# Patient Record
Sex: Female | Born: 1935
Health system: Southern US, Community
[De-identification: ages and names within clinical notes are randomized; demographics above are authoritative.]

---

## 2004-04-16 ENCOUNTER — Ambulatory Visit: Payer: Self-pay | Admitting: Internal Medicine

## 2004-05-12 ENCOUNTER — Ambulatory Visit: Payer: Self-pay | Admitting: Ophthalmology

## 2005-05-13 ENCOUNTER — Ambulatory Visit: Payer: Self-pay | Admitting: Internal Medicine

## 2005-11-29 ENCOUNTER — Encounter: Payer: Self-pay | Admitting: Rheumatology

## 2006-08-04 ENCOUNTER — Ambulatory Visit: Payer: Self-pay | Admitting: Internal Medicine

## 2007-06-21 ENCOUNTER — Ambulatory Visit: Payer: Self-pay | Admitting: Ophthalmology

## 2007-10-19 ENCOUNTER — Ambulatory Visit: Payer: Self-pay | Admitting: Family Medicine

## 2008-06-24 ENCOUNTER — Ambulatory Visit: Payer: Self-pay | Admitting: Family Medicine

## 2008-07-02 ENCOUNTER — Ambulatory Visit: Payer: Self-pay | Admitting: Family Medicine

## 2008-07-08 ENCOUNTER — Ambulatory Visit: Payer: Self-pay | Admitting: Internal Medicine

## 2008-07-16 ENCOUNTER — Ambulatory Visit: Payer: Self-pay | Admitting: Internal Medicine

## 2013-07-01 ENCOUNTER — Emergency Department: Payer: Self-pay | Admitting: Emergency Medicine

## 2013-07-11 ENCOUNTER — Inpatient Hospital Stay: Payer: Self-pay | Admitting: Specialist

## 2013-07-11 LAB — URINALYSIS, COMPLETE
Bilirubin,UR: NEGATIVE
Ketone: NEGATIVE
Nitrite: NEGATIVE
RBC,UR: 4 /HPF (ref 0–5)
Specific Gravity: 1.021 (ref 1.003–1.030)
Squamous Epithelial: 4
WBC UR: 67 /HPF (ref 0–5)

## 2013-07-11 LAB — CBC
HCT: 33.6 % — ABNORMAL LOW (ref 35.0–47.0)
HGB: 11.2 g/dL — ABNORMAL LOW (ref 12.0–16.0)
MCH: 27.9 pg (ref 26.0–34.0)
MCHC: 33.4 g/dL (ref 32.0–36.0)
Platelet: 306 10*3/uL (ref 150–440)
WBC: 8.8 10*3/uL (ref 3.6–11.0)

## 2013-07-11 LAB — COMPREHENSIVE METABOLIC PANEL
Albumin: 3 g/dL — ABNORMAL LOW (ref 3.4–5.0)
Anion Gap: 4 — ABNORMAL LOW (ref 7–16)
BUN: 22 mg/dL — ABNORMAL HIGH (ref 7–18)
Bilirubin,Total: 0.4 mg/dL (ref 0.2–1.0)
Calcium, Total: 8.6 mg/dL (ref 8.5–10.1)
Chloride: 98 mmol/L (ref 98–107)
Co2: 32 mmol/L (ref 21–32)
EGFR (African American): 54 — ABNORMAL LOW
EGFR (Non-African Amer.): 46 — ABNORMAL LOW
Glucose: 97 mg/dL (ref 65–99)
SGPT (ALT): 32 U/L (ref 12–78)
Sodium: 134 mmol/L — ABNORMAL LOW (ref 136–145)

## 2013-07-11 LAB — TROPONIN I: Troponin-I: <0.02 ng/mL

## 2013-07-11 LAB — LIPASE, BLOOD: Lipase: 405 U/L — ABNORMAL HIGH (ref 73–393)

## 2013-07-12 LAB — CLOSTRIDIUM DIFFICILE(ARMC)

## 2013-08-12 DEATH — deceased

## 2014-08-25 IMAGING — CT CT HEAD WITHOUT CONTRAST
2 of 3 series · 17 of 30 positions shown, 20 images · non-contrast
Comparison: 07/01/2013

CLINICAL DATA: Disorientation.  Abdominal pain.

EXAM:
CT HEAD WITHOUT CONTRAST
TECHNIQUE: Contiguous axial images were obtained from the base of the skull
through the vertex without intravenous contrast.

[Series 2: head wo · axial · 0.44mm/px · z∈[+10,+128]mm · 10 of 29 slices shown, 13 images]
[im 3/29  brain]
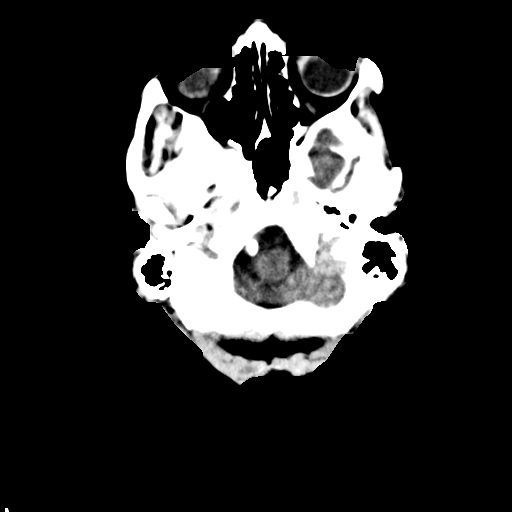
[im 3/29  bone]
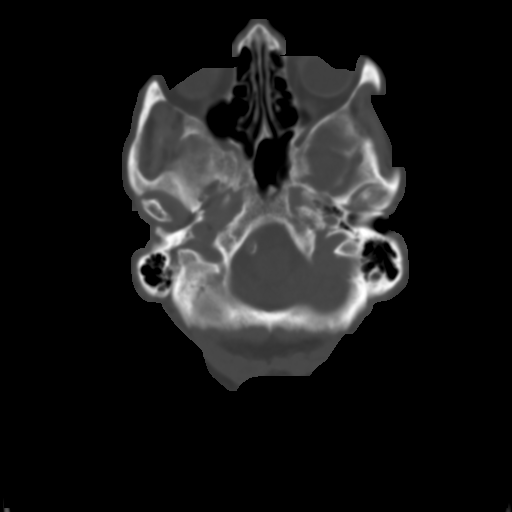
[im 6/29  brain]
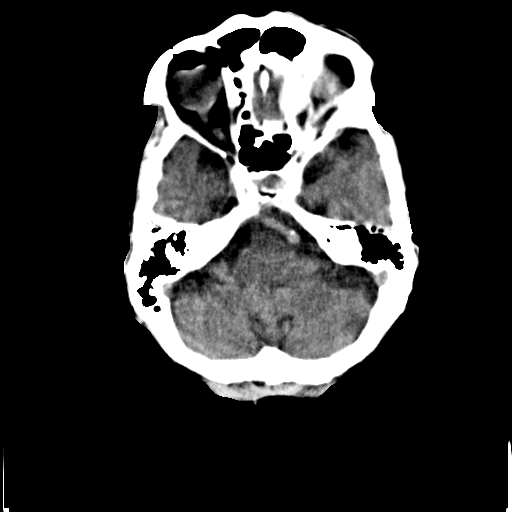
[im 8/29  brain]
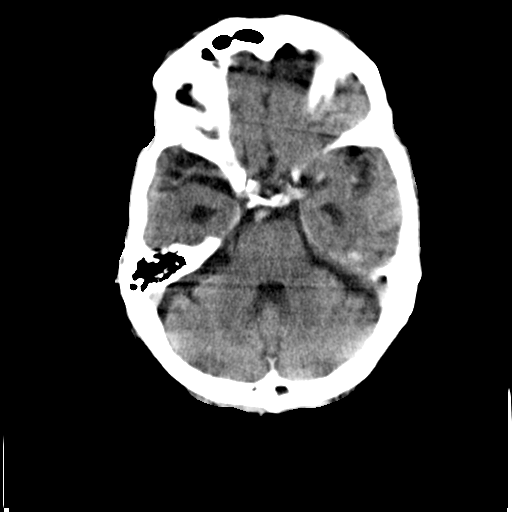
[im 11/29  brain]
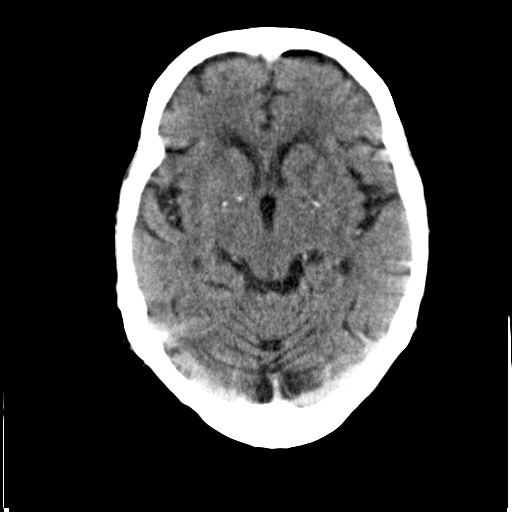
[im 13/29  brain]
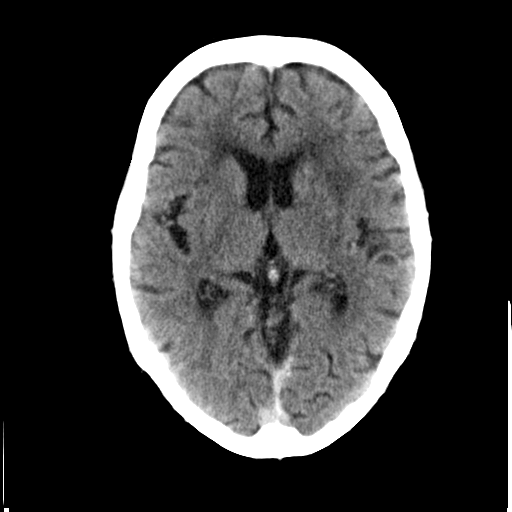
[im 13/29  bone]
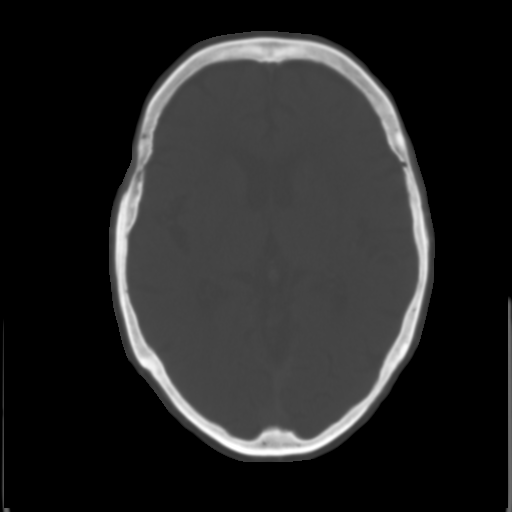
[im 16/29  brain]
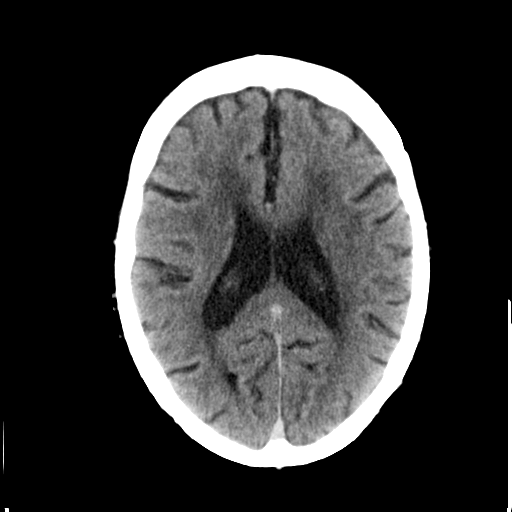
[im 18/29  brain]
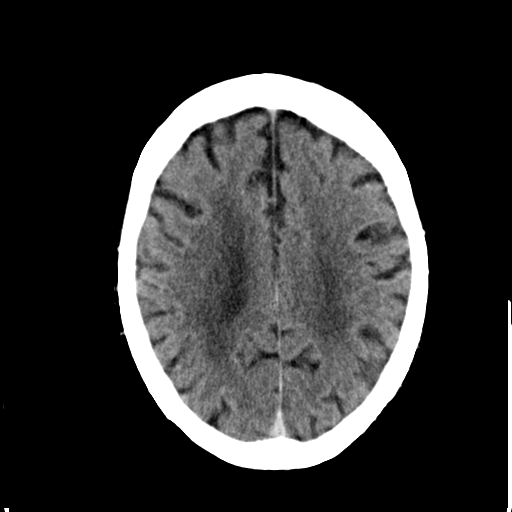
[im 21/29  brain]
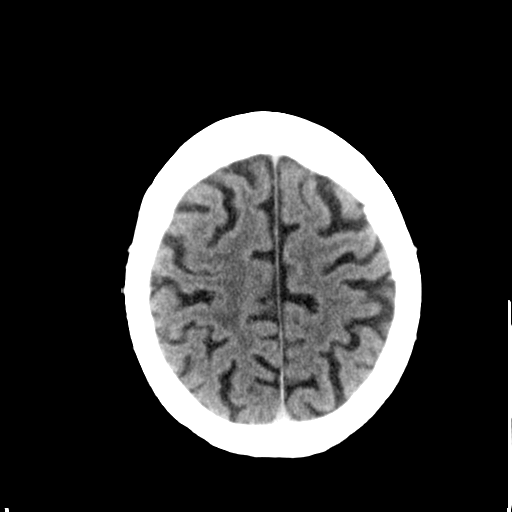
[im 23/29  brain]
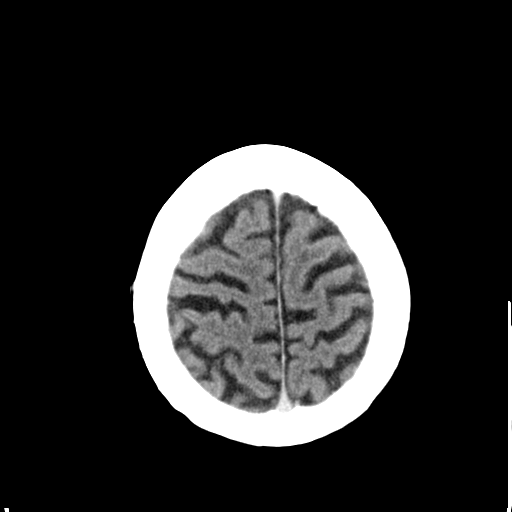
[im 23/29  bone]
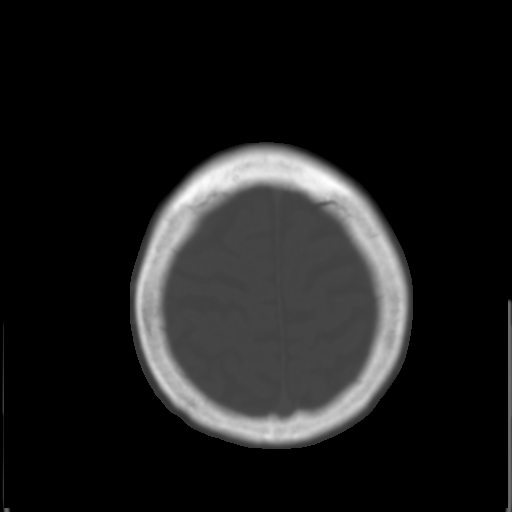
[im 26/29  brain]
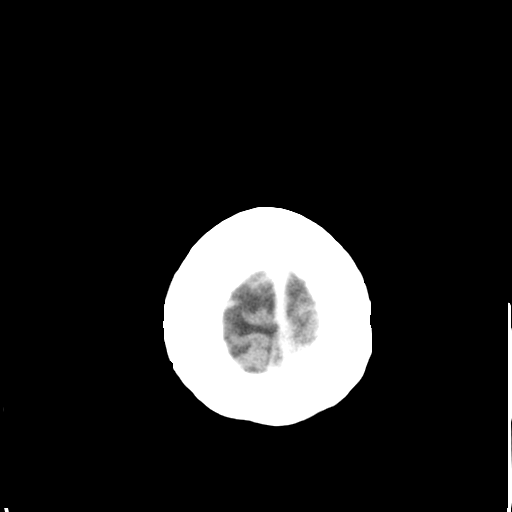

[Series 3: head bone · axial · 0.44mm/px · z∈[+10,+108]mm · 7 of 30 slices shown]
[im 3/30  bone]
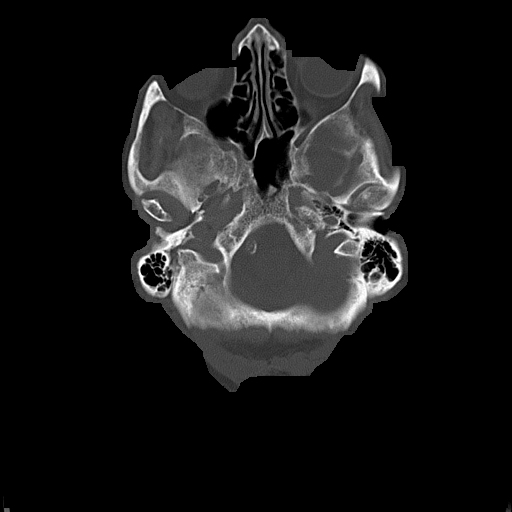
[im 8/30  bone]
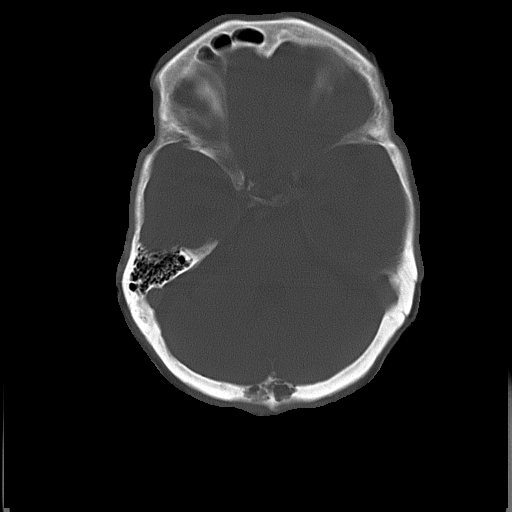
[im 10/30  bone]
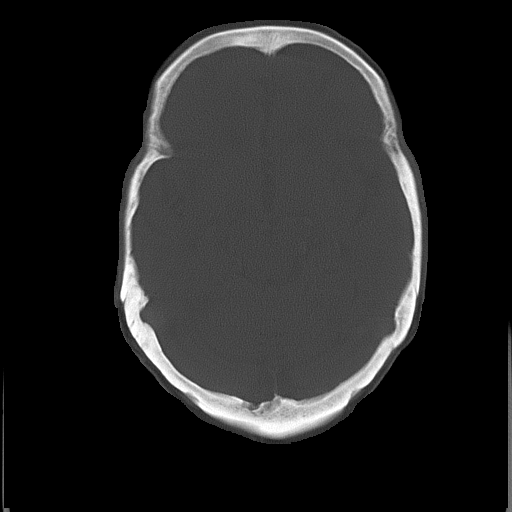
[im 13/30  bone]
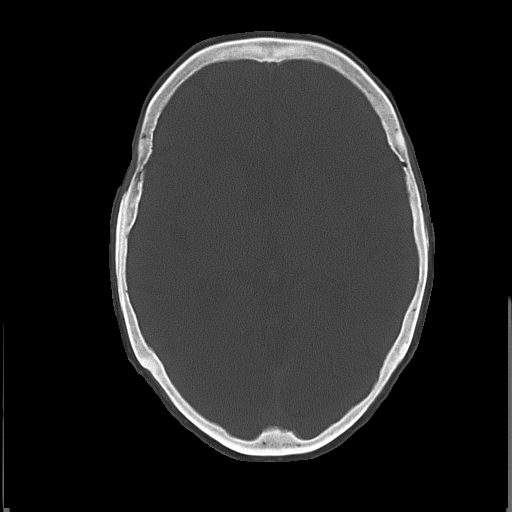
[im 17/30  bone]
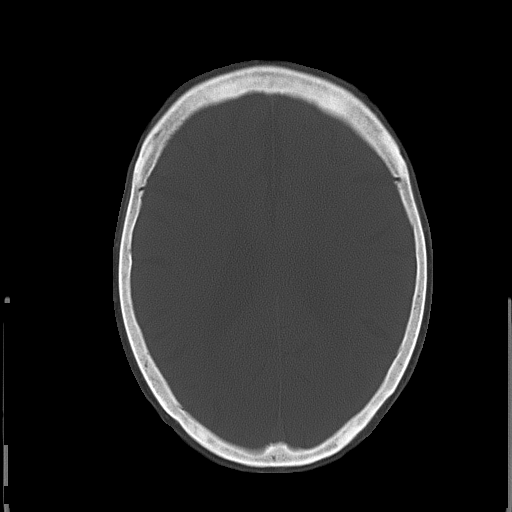
[im 20/30  bone]
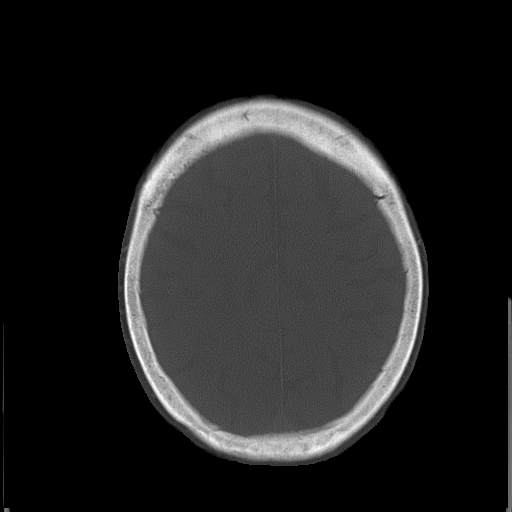
[im 22/30  bone]
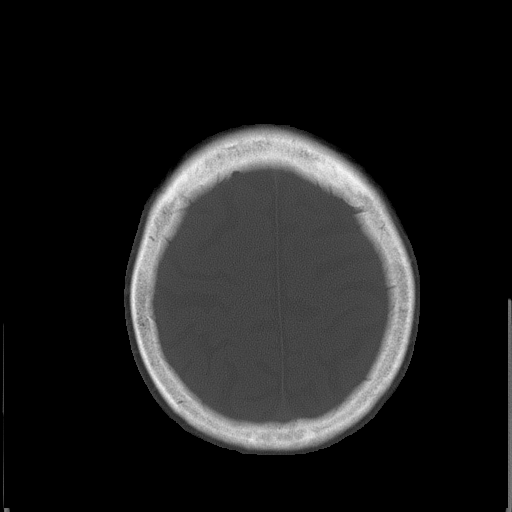

[17 of 30 positions shown; findings below may reference images not displayed]

FINDINGS: The brainstem, cerebellum, cerebral peduncles, thalamus, basal
ganglia, basilar cisterns, and ventricular system appear within
normal limits. Periventricular white matter and corona radiata
hypodensities favor chronic ischemic microvascular white matter
disease. No intracranial hemorrhage, mass lesion, or acute CVA.
IMPRESSION: 1. No acute intracranial findings. Periventricular white matter and
corona radiata hypodensities favor chronic ischemic microvascular
white matter disease.

## 2014-08-25 IMAGING — CT CT ABD-PELV W/ CM
2 of 5 series · 15 of 46 positions shown, 17 images · IV contrast (isovue)
Comparison: None.

CLINICAL DATA: Diffuse abdominal pain.  Constipation.

EXAM:
CT ABDOMEN AND PELVIS WITH CONTRAST
TECHNIQUE: Multidetector CT imaging of the abdomen and pelvis was performed
using the standard protocol following bolus administration of
intravenous contrast.
CONTRAST:  80 cc Isovue 370

[Series 6: cor routine abd pel with · coronal · 0.62mm/px · 3 of 112 slices shown]
[im 38/112  soft-tissue]
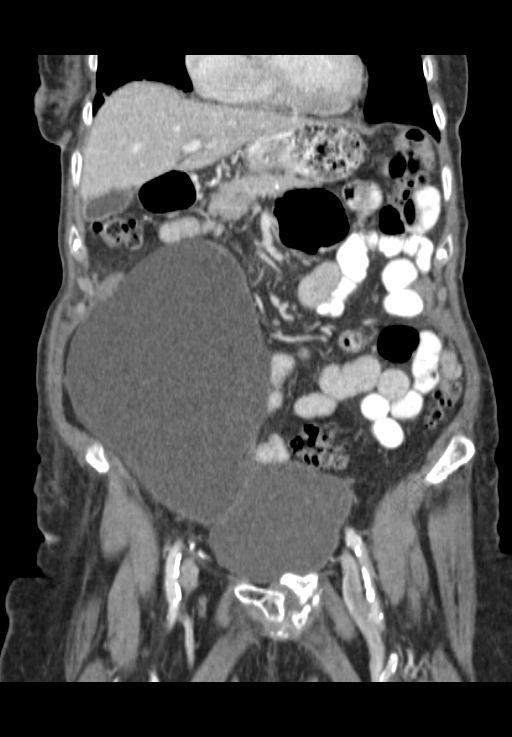
[im 50/112  soft-tissue]
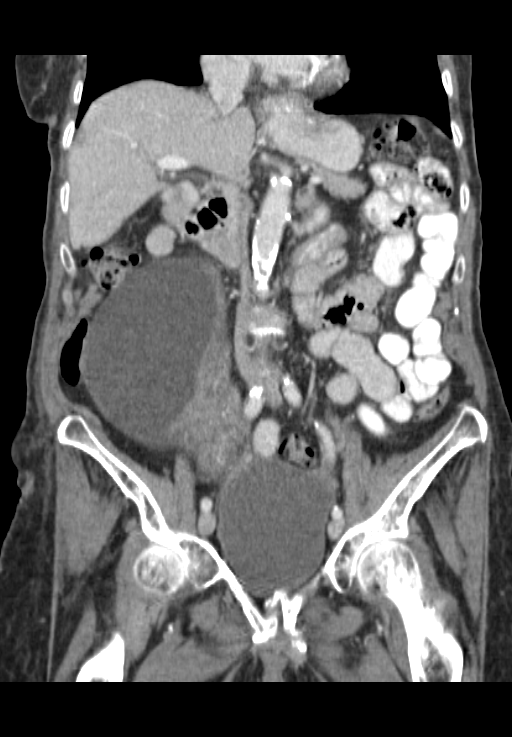
[im 62/112  soft-tissue]
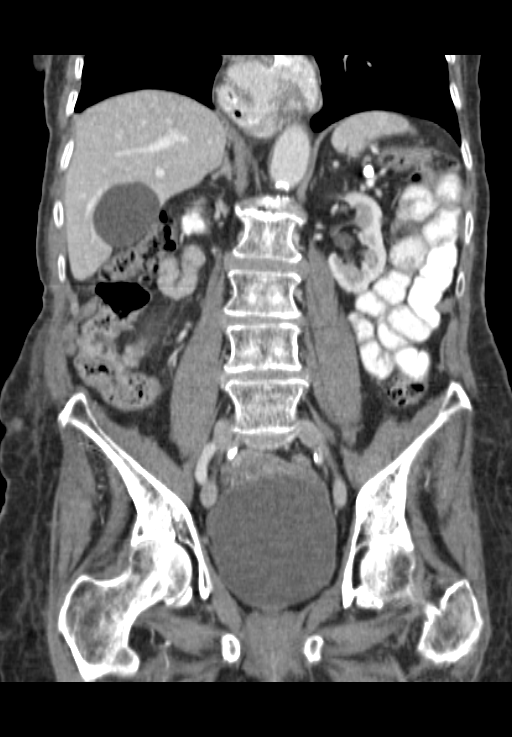

[Series 8: routine abd pel with 2 · axial · 0.64mm/px · z∈[-329,+1]mm · 12 of 79 slices shown, 14 images]
[im 7/79  soft-tissue]
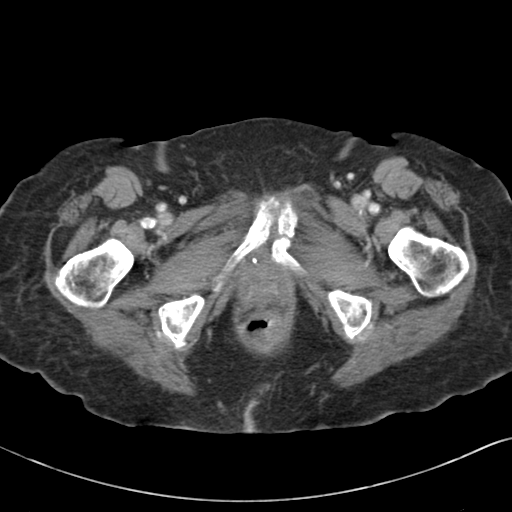
[im 7/79  bone]
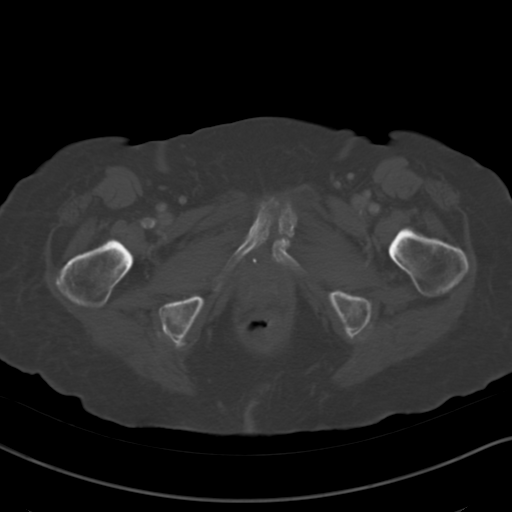
[im 13/79  soft-tissue]
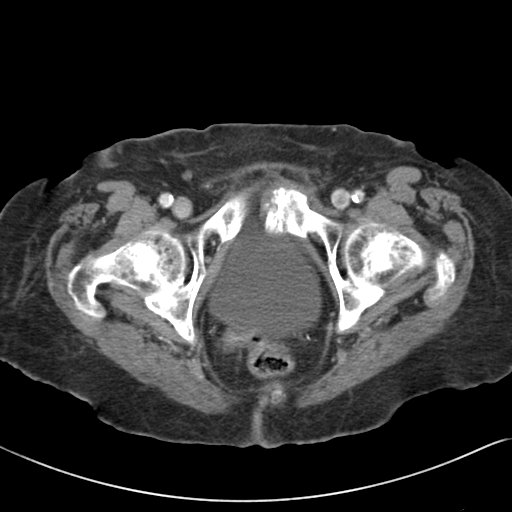
[im 19/79  soft-tissue]
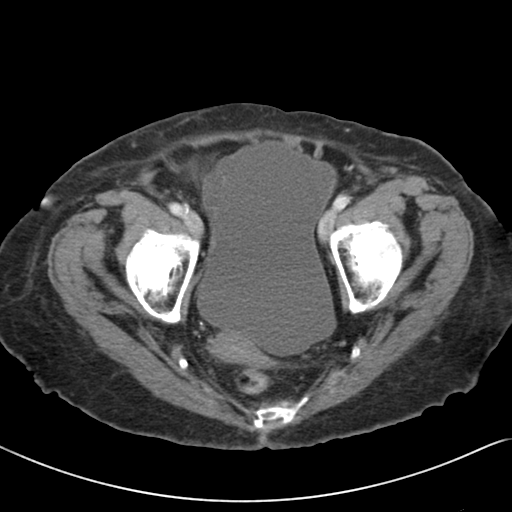
[im 25/79  soft-tissue]
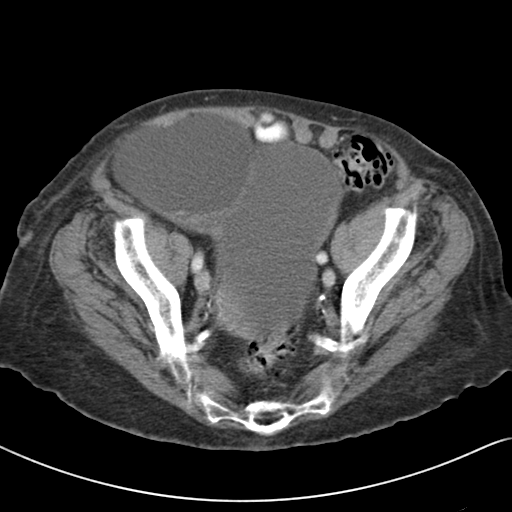
[im 31/79  soft-tissue]
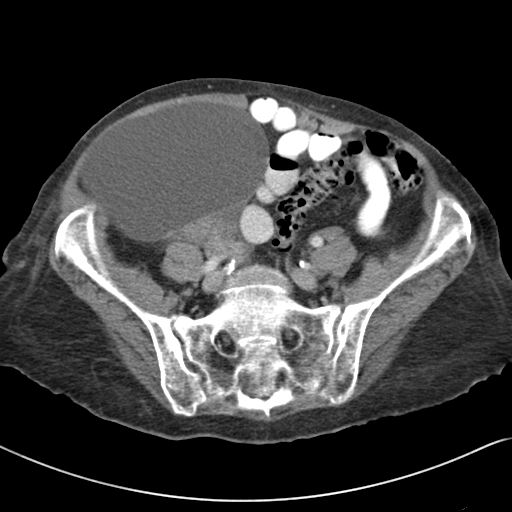
[im 37/79  soft-tissue]
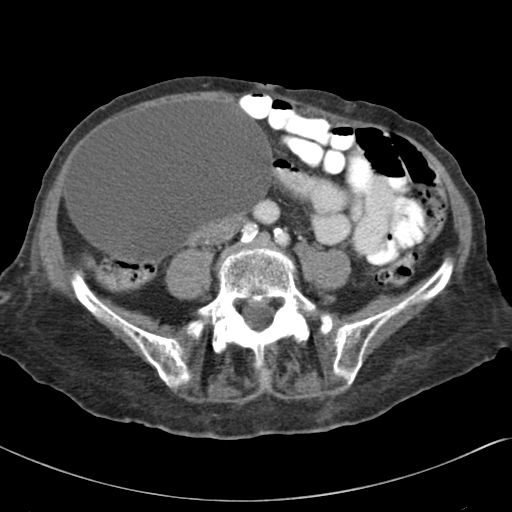
[im 43/79  soft-tissue]
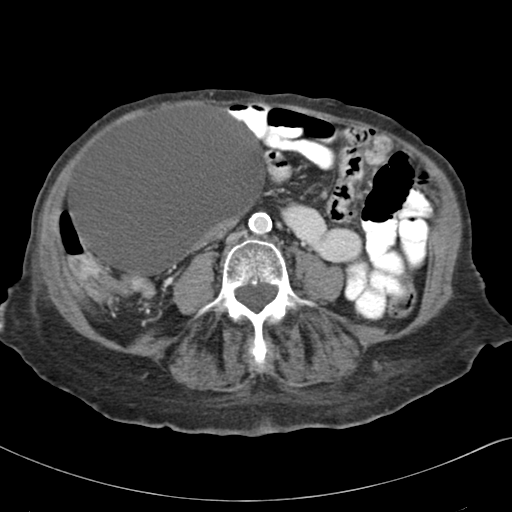
[im 49/79  soft-tissue]
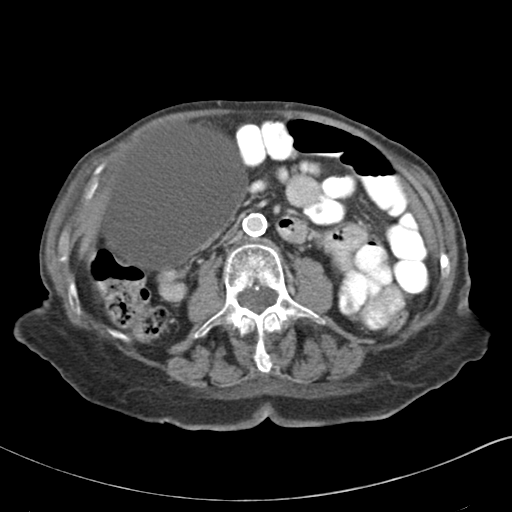
[im 55/79  soft-tissue]
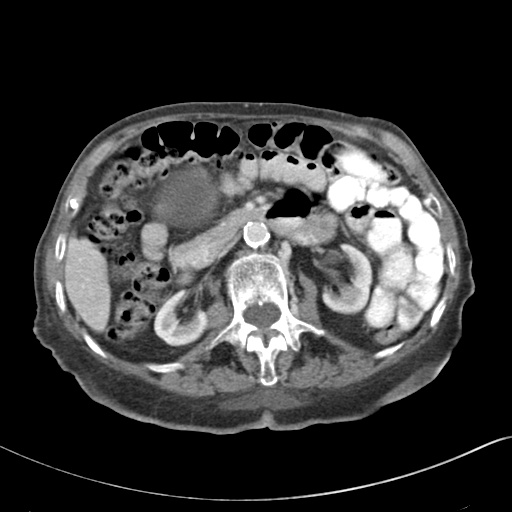
[im 55/79  bone]
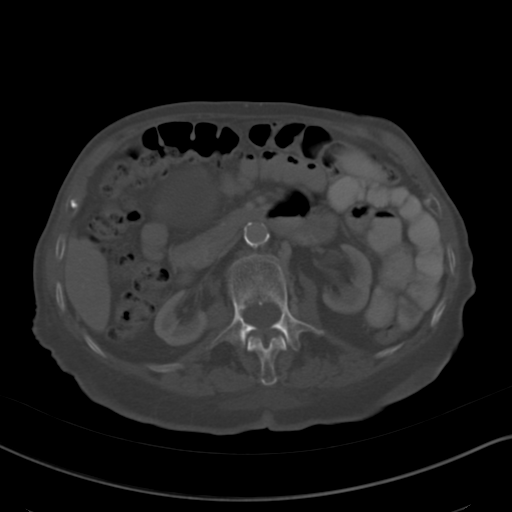
[im 61/79  soft-tissue]
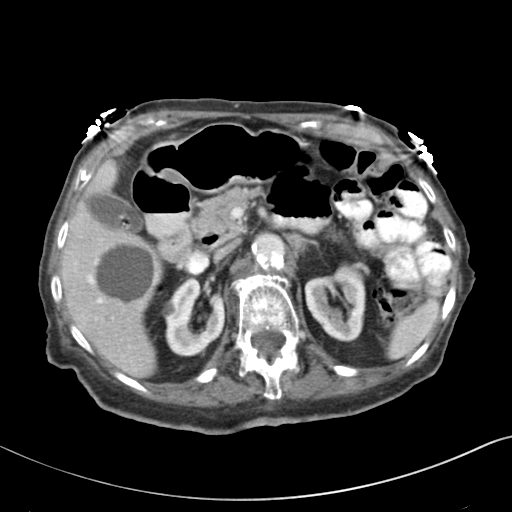
[im 67/79  soft-tissue]
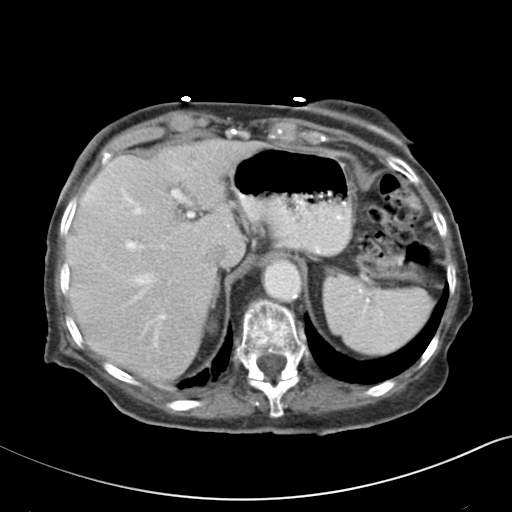
[im 73/79  soft-tissue]
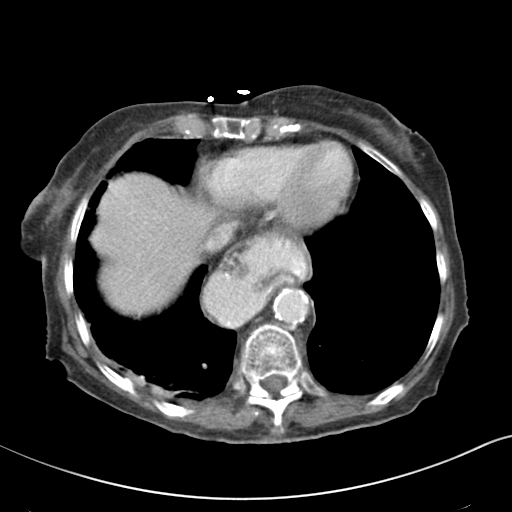

[15 of 46 positions shown; findings below may reference images not displayed]

FINDINGS: A moderate size hiatal hernia is seen. Cholelithiasis is
demonstrated, however there is no evidence of acute cholecystitis.
No evidence of biliary ductal dilatation. Large duodenal diverticula
incidentally noted.

A small left hepatic lobe hemangioma is seen as well as the other
benign hepatic cysts in both right and left lobes. The pancreas,
spleen, and adrenal glands are normal in appearance. Tiny renal
cysts noted bilaterally however there is no evidence of renal masses
or hydronephrosis.

A large cystic lesion is seen in the right abdomen and pelvis which
appears to originate from the right ovary. This shows some mural
soft tissue density posteriorly as well as a tiny calcification.
This measures 18.3 by 13.1 x 13.5 cm, and is suspicious for a cystic
ovarian neoplasm. Uterus is unremarkable in appearance.

The urinary bladder is mildly dilated and shows mild wall
thickening, suspicious for neurogenic bladder. No focal bladder mass
visualized. Old bilateral pubic rami fracture deformities are noted.

Severe colonic diverticulosis is demonstrated, however there is no
evidence of diverticulitis. No other inflammatory process or
abnormal fluid collections are identified.
IMPRESSION: 18 cm right adnexal cystic lesion with peripheral soft tissue
density posteriorly, suspicious for cystic ovarian neoplasm.
Surgical evaluation should be considered.

Moderate hiatal hernia.

Distended urinary bladder with mild wall thickening, suspicious for
neurogenic bladder. Old bilateral pubic rami fracture deformities
noted.

Cholelithiasis. No radiographic evidence of cholecystitis.

Diverticulosis. No radiographic evidence of diverticulitis.

Benign hepatic hemangioma and cysts.

## 2014-11-02 NOTE — Discharge Summary (Signed)
PATIENT NAME:  Lisa Decker, [name] MR#:  [id number] DATE OF BIRTH:  1916/08/04  DATE OF ADMISSION:  07/11/2013 DATE OF DISCHARGE:  07/16/2013  For a detailed note, please take a look at the history and physical done on admission by Dr. Angelica Ranavid Hower.   DIAGNOSES AT DISCHARGE: Abdominal pain, likely related to ovarian cancer. Ovarian cancer.   DISPOSITION:  The patient is being discharged to hospice home.   DISCHARGE MEDICATIONS: Tylenol 500 mg q.6 hours as needed, Roxanol 0.25 mL q.1 to 2 hours as needed, Xanax 1 mg 0.5 mL t.i.d. as needed, Zofran 4 mg t.i.d. as needed.   BRIEF HOSPITAL COURSE: This is a 81 year old female with medical problems as mentioned above, presented to the hospital on July 12, 2013, due to abdominal pain. The patient underwent a CT scan of the abdomen and pelvis which showed an adnexal lesion suspicious for ovarian cancer. The patient also had a CT of the head done for workup for metastases which was negative. Given her advanced age and a new diagnosis for ovarian cancer, the family opted for just comfort care and no further aggressive management. The patient was, therefore, just admitted under comfort care only. The patient was placed on a morphine drip along with Ativan and a scopolamine patch. The patient remained fairly comfortable throughout the hospital course and eventually was discharged to hospice home on 07/16/2013. The family was aware of this plan.   TIME SPENT ON DISCHARGE: 35 minutes.   ____________________________ Rolly PancakeVivek J. Cherlynn KaiserSainani, MD vjs:cs D: 07/16/2013 16:17:26 ET T: 07/16/2013 19:08:47 ET JOB#: [id number]  cc: Rolly PancakeVivek J. Cherlynn KaiserSainani, MD, <Dictator> Houston SirenVIVEK J Eshan Trupiano MD ELECTRONICALLY SIGNED 08/08/2013 11:19

## 2014-11-02 NOTE — H&P (Signed)
PATIENT NAME:  Lisa Decker, Phylliss C MR#:  [id number] DATE OF BIRTH:  [birth year]  DATE OF ADMISSION:  07/11/2013  REFERRING PHYSICIAN: Dr. Mayford KnifeWilliams.   PRIMARY CARE PHYSICIAN: Dr. Alison MurrayAndreassi    CHIEF COMPLAINT: Abdominal pain.  HISTORY OF PRESENT ILLNESS:  This is a 81 year old Caucasian female with past medical history of dementia, who is presenting with abdominal pain from her nursing facility. She is unable to provide any further information secondary to dementia; however, her family at bedside attests to her having intermittent abdominal pain of 4 days' duration, which right-sided without radiation, with associated constipation, unable to fully grade quality or intensity. She describes no worsening or relieving factors. She did have a bowel movement just today after receiving Fleet enema; however, no bowel movements for the prior four days and she was brought to the Emergency Department for further work-up and evaluation. On an abdominal CT, she was found to have an 18 cm adnexal mass. With this information discussion between the Emergency Department staff and the family and decision was made to make her comfort care only. Currently, the patient is without complaints.   REVIEW OF SYSTEMS: Unable to obtain secondary to baseline mental status.   PAST MEDICAL HISTORY: Dementia as well as arthritis and hypertension.   SOCIAL HISTORY: Denies any alcohol, tobacco or drug usage, per family; however, she is essentially bedridden at her nursing facility.   FAMILY HISTORY: Denies any known cardiovascular disease.   ALLERGIES: No known drug allergies.   HOME MEDICATIONS: Include acetaminophen 500 mg p.o. q. 6 hours for pain or fever, alprazolam 0.25 mg p.o. b.i.d. as needed for anxiety, escitalopram 10 mg p.o. daily, hydrochlorothiazide 12.5 mg p.o. daily.   PHYSICAL EXAMINATION: VITAL SIGNS: Temperature 97.7, heart rate 94, respirations 18, blood pressure 106/79, saturating 96% on room air. Weight 54.4  kg, BMI 21.3.  GENERAL: Malnourished, frail Caucasian female who is currently in no acute distress.  HEAD: Normocephalic, atraumatic.  EYES: Pupils are equal, round and reactive to light, extraocular muscles are intact.  No scleral icterus.  MOUTH: Dry mucosal membranes. Dentition intact. No abscess noted.  EAR, NOSE, THROAT:  Throat clear without exudates. No external lesions.  NECK: Supple. No thyromegaly. No nodules. No JVD.  PULMONARY: Clear to auscultation bilaterally. No wheezes, rales or rhonchi. No use of accessory muscles. Good respiratory effort.  CHEST: Nontender to palpation.  CARDIOVASCULAR: S1, S2, regular rate and rhythm. No murmurs, rubs, or gallops. Edema 2+ bilaterally. Pedal pulses 2+.  GASTROINTESTINAL: Soft, nontender, mildly distended. No masses. Hypoactive bowel sounds. No hepatosplenomegaly.  MUSCULOSKELETAL: No swelling or clubbing. However, edema 2 to 3+ as noted above. Range of motion full in all extremities.  NEUROLOGIC: Cranial nerves II through XII intact. Sensation appears to be intact. Reflexes intact.  SKIN: No ulcerations, lesions, rash or cyanosis. Skin warm, dry, skin turgor poor.  PSYCHIATRIC: Mood and affect: The patient agitated and confused. She is awake; however, not oriented to person and place or time. Insight and judgment are poor.   LABORATORY DATA: Sodium 134, potassium 3.4, chloride 98, bicarbonate 32, BUN 22, creatinine 1.03, glucose 97, lipase 405, total protein 6.5, albumin 3, alkaline phosphatase 137, AST 39, the remainder of LFTs are within normal limits. WBC 8.8, hemoglobin 11.2, platelets of 306.   Urinalysis: WBCs 657, RBCs 4, leukocyte esterase 1+, epithelial cells 4.   CT abdomen performed revealing an 18 cm right adnexal cystic lesion with peripheral soft tissue density, suspicious for cystic ovarian neoplasm, as well as a  moderate hiatal hernia, distended urinary bladder with mild wall thickening suspicious for neurogenic bladder.    ASSESSMENT AND PLAN: A 81 year old white female history of dementia, presenting with abdominal pain which has been present for four days. 1. Abdominal pain related to constipation as well as newly found ovarian cancer. The family wishes the patient to be comfort care only. After discussion of the case, she will be admitted for pain control. We will provide as needed morphine as well as Haldol as needed for agitation. We will consult palliative care.  2.  For her anxiety, we will continue her as needed alprazolam as well as her Lexapro.   She once again she will be made DO NOT RESUSCITATE and comfort measures only.   TIME SPENT: 45 minutes    ____________________________ Cletis Athens. Joselin Crandell, MD dkh:cc D: 07/12/2013 00:07:09 ET T: 07/12/2013 00:52:09 ET JOB#: [id number]  cc: Cletis Athens. Takerra Lupinacci, MD, <Dictator> Beni Turrell Synetta Shadow MD ELECTRONICALLY SIGNED 07/12/2013 2:29
